# Patient Record
Sex: Female | Born: 1970 | Race: White | Hispanic: No | Marital: Single | Smoking: Former smoker
Health system: Southern US, Community
[De-identification: ages and names within clinical notes are randomized; demographics above are authoritative.]

## PROBLEM LIST (undated history)

## (undated) DIAGNOSIS — I10 Essential (primary) hypertension: Secondary | ICD-10-CM

## (undated) HISTORY — DX: Essential (primary) hypertension: I10

---

## 2009-05-28 ENCOUNTER — Emergency Department (HOSPITAL_BASED_OUTPATIENT_CLINIC_OR_DEPARTMENT_OTHER): Admission: EM | Admit: 2009-05-28 | Discharge: 2009-05-28 | Payer: Self-pay | Admitting: Emergency Medicine

## 2009-11-29 ENCOUNTER — Emergency Department (HOSPITAL_COMMUNITY): Admission: EM | Admit: 2009-11-29 | Discharge: 2009-11-29 | Payer: Self-pay | Admitting: Emergency Medicine

## 2009-12-07 ENCOUNTER — Emergency Department (HOSPITAL_BASED_OUTPATIENT_CLINIC_OR_DEPARTMENT_OTHER): Admission: EM | Admit: 2009-12-07 | Discharge: 2009-12-07 | Payer: Self-pay | Admitting: Emergency Medicine

## 2013-12-28 LAB — CBC AND DIFFERENTIAL
HEMATOCRIT: 46 % (ref 36–46)
Hemoglobin: 15.2 g/dL (ref 12.0–16.0)
Platelets: 316 10*3/uL (ref 150–399)
WBC: 9.2 10^3/mL

## 2013-12-28 LAB — BASIC METABOLIC PANEL
BUN: 11 mg/dL (ref 4–21)
Creatinine: 1 mg/dL (ref 0.5–1.1)
Glucose: 77 mg/dL
POTASSIUM: 4.6 mmol/L (ref 3.4–5.3)
SODIUM: 137 mmol/L (ref 137–147)

## 2014-01-28 ENCOUNTER — Ambulatory Visit (INDEPENDENT_AMBULATORY_CARE_PROVIDER_SITE_OTHER): Payer: PRIVATE HEALTH INSURANCE | Admitting: Physician Assistant

## 2014-01-28 ENCOUNTER — Encounter: Payer: Self-pay | Admitting: Physician Assistant

## 2014-01-28 VITALS — BP 143/76 | HR 86 | Ht 65.0 in | Wt 250.0 lb

## 2014-01-28 DIAGNOSIS — F411 Generalized anxiety disorder: Secondary | ICD-10-CM

## 2014-01-28 DIAGNOSIS — J45901 Unspecified asthma with (acute) exacerbation: Secondary | ICD-10-CM

## 2014-01-28 DIAGNOSIS — I10 Essential (primary) hypertension: Secondary | ICD-10-CM

## 2014-01-28 DIAGNOSIS — G4700 Insomnia, unspecified: Secondary | ICD-10-CM

## 2014-01-28 DIAGNOSIS — M542 Cervicalgia: Secondary | ICD-10-CM

## 2014-01-28 DIAGNOSIS — F329 Major depressive disorder, single episode, unspecified: Secondary | ICD-10-CM

## 2014-01-28 DIAGNOSIS — F32A Depression, unspecified: Secondary | ICD-10-CM

## 2014-01-28 DIAGNOSIS — F3289 Other specified depressive episodes: Secondary | ICD-10-CM

## 2014-01-28 DIAGNOSIS — J45909 Unspecified asthma, uncomplicated: Secondary | ICD-10-CM

## 2014-01-28 MED ORDER — BUDESONIDE-FORMOTEROL FUMARATE 80-4.5 MCG/ACT IN AERO: 2.0000 | INHALATION_SPRAY | Freq: Two times a day (BID) | RESPIRATORY_TRACT | Status: AC

## 2014-01-28 MED ORDER — CITALOPRAM HYDROBROMIDE 10 MG PO TABS
10.0000 mg | ORAL_TABLET | Freq: Every day | ORAL | Status: AC
Start: 2014-01-28 — End: ?

## 2014-01-28 MED ORDER — HYDROCODONE-ACETAMINOPHEN 7.5-325 MG PO TABS: 1.0000 | ORAL_TABLET | Freq: Four times a day (QID) | ORAL | Status: DC | PRN

## 2014-01-28 MED ORDER — LISINOPRIL 20 MG PO TABS: 20.0000 mg | ORAL_TABLET | Freq: Every day | ORAL | Status: AC

## 2014-01-28 MED ORDER — HYDROCHLOROTHIAZIDE 25 MG PO TABS: 25.0000 mg | ORAL_TABLET | Freq: Every day | ORAL | Status: AC

## 2014-01-28 NOTE — Patient Instructions (Addendum)
Needs spironmetry to schedule in next couple of weeks.  Start celexa 1/2 tab for 1 week and then increase to 1 tab.  Added Lisinopril 20mg  to HCTZ 25mg . Start symbicort 2 puffs twice a day.   Follow up 4-6 weeks.

## 2014-01-29 ENCOUNTER — Telehealth: Payer: Self-pay | Admitting: Physician Assistant

## 2014-01-29 DIAGNOSIS — G4700 Insomnia, unspecified: Secondary | ICD-10-CM | POA: Insufficient documentation

## 2014-01-29 DIAGNOSIS — M542 Cervicalgia: Secondary | ICD-10-CM | POA: Insufficient documentation

## 2014-01-29 DIAGNOSIS — J45909 Unspecified asthma, uncomplicated: Secondary | ICD-10-CM | POA: Insufficient documentation

## 2014-01-29 MED ORDER — PREDNISONE 50 MG PO TABS: ORAL_TABLET | ORAL | Status: AC

## 2014-01-29 NOTE — Telephone Encounter (Signed)
Ok to send prednisone 50mg  one tablet for 5 days. #5 no refills.

## 2014-01-29 NOTE — Telephone Encounter (Signed)
rx sent

## 2014-01-29 NOTE — Progress Notes (Signed)
Subjective:    Patient ID: Kari Harris, female    DOB: 23-Jun-1971, 43 y.o.   MRN: [id number]  HPI Pt is a 43 yo female who presents to the clinic to establish care.   . Active Ambulatory Problems    Diagnosis Date Noted  . Essential hypertension, benign 01/28/2014  . Unspecified asthma(493.90) 01/29/2014  . Insomnia 01/29/2014  . Cervical pain (neck) 01/29/2014   Resolved Ambulatory Problems    Diagnosis Date Noted  . No Resolved Ambulatory Problems   Past Medical History  Diagnosis Date  . Hypertension    . History   Social History  . Marital Status: Single    Spouse Name: N/A    Number of Children: N/A  . Years of Education: N/A   Occupational History  . Not on file.   Social History Main Topics  . Smoking status: Former Games developermoker  . Smokeless tobacco: Not on file  . Alcohol Use: No  . Drug Use: No  . Sexual Activity: Not Currently   Other Topics Concern  . Not on file   Social History Narrative  . No narrative on file   . Family History  Problem Relation Age of Onset  . Family history unknown: Yes   Pt has multiple problems today. She needs refills on all medications.   HTN- not currently on any medications due to needed refill. Denies any CP, palpitaitons, headaches, vision changes.   Pt has ongoing insomnia. She has tried trazodone which makes her drowsy the next day, xanax which helps but doesn't feel like it is enough, and [place] worked at 10[place]  but doctor took off. She just feels over the top anxious. Finds it hard to sit still, easily annoyed. She also has little to no energy, little interest in doing things. She does not remember trying any anti-depressants.   She is having a lot of problems breathing today. She was recently seen 12/31/2013 in ER for Asthma exacerbation she was given duoneb, and prednisone. She is feeling much better but having to use duoneb twice a day. Her chest fills very tight today. No fever, chills, sinus pressure,  ear pain, ST. Her cough is dry. She recently quit smoking 12/31/13. She does wheeze a lot throughout the day. Had asthma since she was a child.   She has ongoing cervical neck pain. Denies any known injury. Has been in pain for almost a year and 1/2 now. Taking norco and oxycodone at one point. Would like to get off. Tried PT in past and did help. Interested in injections. MrI scheduled for tomorrow. Pain is constant. Worse with movement. xrays showed lordosis but no significant disc herniation or spondoloysis.  Tried lyrica and gabapentin and helped not sure why not own now.    Review of Systems  All other systems reviewed and are negative.       Objective:   Physical Exam  Constitutional: She is oriented to person, place, and time. She appears well-developed and well-nourished.  HENT:  Head: Normocephalic and atraumatic.  Right Ear: External ear normal.  Left Ear: External ear normal.  Nose: Nose normal.  Mouth/Throat: Oropharynx is clear and moist.  Eyes: Conjunctivae are normal.  Neck: Normal range of motion. Neck supple.  Cardiovascular: Normal rate, regular rhythm and normal heart sounds.   Pulmonary/Chest:  Bilateral lung wheezing. Just had duoneb at her house.   Lymphadenopathy:    She has no cervical adenopathy.  Neurological: She is alert and oriented to person, place,  and time.  Skin: Skin is dry.  Psychiatric: She has a normal mood and affect. Her behavior is normal.          Assessment & Plan:  Asthma uncontrolled- given albuterol inhaler to take home to carry with her to use every 4-6 hours as needed for wheezing. Continue to use duoneb as needed but hopefully should start to use less and less.  Started symbicort 2 puffs BID daily. Follow up in 4 weeks.   GAD/Depression/insomnia- GAD-7 was 16. PHQ-9 was 17. I would like to add celexa to daily medication to help with sleep. Continue xanax but as celexa takes root can use as needed. Follow up in 4 weeks. Discussed  counselor may help.   HTN- restart lisinopril and HCTZ. Follow up in 4 weeks.   Smoking cessation- encouraged pt along journey. Continue on nicoderm daily.   Cervical neck pain- MRI tomorrow so will know more. Will refill norco prn every 8 hours today. Discussed with pt that we not do long term pain and need to look at more interventional approach. Did not have time today to fully evaluate. Follow up in 4 weeks.

## 2014-01-29 NOTE — Telephone Encounter (Signed)
Spoke with pt & she states that the symbicort we gave her is helping some but her breathing still isn't great. She would like prednisone.

## 2014-01-29 NOTE — Telephone Encounter (Signed)
Find out how pt is breathing. Went over note and realized I did not treat with another round of steroids. If breathing not getting better let me know and will send a few days of prednisone to pharmacy.

## 2014-02-11 ENCOUNTER — Other Ambulatory Visit: Payer: PRIVATE HEALTH INSURANCE | Admitting: Physician Assistant

## 2014-02-12 ENCOUNTER — Encounter: Payer: Self-pay | Admitting: *Deleted

## 2015-01-27 ENCOUNTER — Emergency Department (HOSPITAL_COMMUNITY): Payer: PRIVATE HEALTH INSURANCE

## 2015-01-27 ENCOUNTER — Emergency Department (HOSPITAL_COMMUNITY)
Admission: EM | Admit: 2015-01-27 | Discharge: 2015-01-27 | Disposition: A | Discharge status: Home / Self Care | Payer: Self-pay | Attending: Emergency Medicine | Admitting: Emergency Medicine

## 2015-01-27 ENCOUNTER — Encounter (HOSPITAL_COMMUNITY): Payer: Self-pay | Admitting: Emergency Medicine

## 2015-01-27 ENCOUNTER — Emergency Department (HOSPITAL_COMMUNITY): Payer: Self-pay

## 2015-01-27 DIAGNOSIS — Z87891 Personal history of nicotine dependence: Secondary | ICD-10-CM | POA: Insufficient documentation

## 2015-01-27 DIAGNOSIS — R109 Unspecified abdominal pain: Secondary | ICD-10-CM

## 2015-01-27 DIAGNOSIS — Z720 Tobacco use: Secondary | ICD-10-CM

## 2015-01-27 DIAGNOSIS — M549 Dorsalgia, unspecified: Secondary | ICD-10-CM | POA: Insufficient documentation

## 2015-01-27 DIAGNOSIS — R911 Solitary pulmonary nodule: Secondary | ICD-10-CM | POA: Insufficient documentation

## 2015-01-27 DIAGNOSIS — Z79899 Other long term (current) drug therapy: Secondary | ICD-10-CM | POA: Insufficient documentation

## 2015-01-27 DIAGNOSIS — R319 Hematuria, unspecified: Secondary | ICD-10-CM | POA: Insufficient documentation

## 2015-01-27 DIAGNOSIS — I10 Essential (primary) hypertension: Secondary | ICD-10-CM | POA: Insufficient documentation

## 2015-01-27 LAB — URINALYSIS, ROUTINE W REFLEX MICROSCOPIC
Bilirubin Urine: NEGATIVE
Glucose, UA: NEGATIVE mg/dL
Ketones, ur: NEGATIVE mg/dL
LEUKOCYTES UA: NEGATIVE
NITRITE: NEGATIVE
Protein, ur: NEGATIVE mg/dL
SPECIFIC GRAVITY, URINE: 1.021 (ref 1.005–1.030)
Urobilinogen, UA: 0.2 mg/dL (ref 0.0–1.0)
pH: 7 (ref 5.0–8.0)

## 2015-01-27 LAB — COMPREHENSIVE METABOLIC PANEL
ALBUMIN: 3.8 g/dL (ref 3.5–5.2)
ALK PHOS: 86 U/L (ref 39–117)
ALT: 12 U/L (ref 0–35)
AST: 14 U/L (ref 0–37)
Anion gap: 7 (ref 5–15)
BUN: 7 mg/dL (ref 6–23)
CALCIUM: 9 mg/dL (ref 8.4–10.5)
CO2: 25 mmol/L (ref 19–32)
Chloride: 107 mmol/L (ref 96–112)
Creatinine, Ser: 0.76 mg/dL (ref 0.50–1.10)
GFR calc Af Amer: >90 mL/min (ref >90)
GFR calc non Af Amer: >90 mL/min (ref >90)
Glucose, Bld: 88 mg/dL (ref 70–99)
Potassium: 4.1 mmol/L (ref 3.5–5.1)
SODIUM: 139 mmol/L (ref 135–145)
TOTAL PROTEIN: 7 g/dL (ref 6.0–8.3)
Total Bilirubin: 0.6 mg/dL (ref 0.3–1.2)

## 2015-01-27 LAB — CBC
HEMATOCRIT: 41.2 % (ref 36.0–46.0)
Hemoglobin: 13.6 g/dL (ref 12.0–15.0)
MCH: 29.2 pg (ref 26.0–34.0)
MCHC: 33 g/dL (ref 30.0–36.0)
MCV: 88.6 fL (ref 78.0–100.0)
Platelets: 281 10*3/uL (ref 150–400)
RBC: 4.65 MIL/uL (ref 3.87–5.11)
RDW: 12.7 % (ref 11.5–15.5)
WBC: 7.3 10*3/uL (ref 4.0–10.5)

## 2015-01-27 LAB — URINE MICROSCOPIC-ADD ON

## 2015-01-27 MED ORDER — IOHEXOL 300 MG/ML  SOLN
80.0000 mL | Freq: Once | INTRAMUSCULAR | Status: AC | PRN
  Administered 2015-01-27: 80 mL via INTRAVENOUS

## 2015-01-27 MED ORDER — ONDANSETRON 4 MG PO TBDP
4.0000 mg | ORAL_TABLET | Freq: Once | ORAL | Status: AC
  Administered 2015-01-27: 4 mg via ORAL
  Filled 2015-01-27 (×2): qty 1

## 2015-01-27 MED ORDER — OXYCODONE-ACETAMINOPHEN 5-325 MG PO TABS
2.0000 | ORAL_TABLET | Freq: Once | ORAL | Status: AC
  Administered 2015-01-27: 2 via ORAL
  Filled 2015-01-27: qty 2

## 2015-01-27 MED ORDER — KETOROLAC TROMETHAMINE 60 MG/2ML IM SOLN
60.0000 mg | Freq: Once | INTRAMUSCULAR | Status: AC
  Administered 2015-01-27: 60 mg via INTRAMUSCULAR

## 2015-01-27 MED ORDER — IPRATROPIUM-ALBUTEROL 0.5-2.5 (3) MG/3ML IN SOLN
3.0000 mL | Freq: Once | RESPIRATORY_TRACT | Status: AC
  Administered 2015-01-27: 3 mL via RESPIRATORY_TRACT
  Filled 2015-01-27: qty 3

## 2015-01-27 MED ORDER — HYDROCODONE-ACETAMINOPHEN 5-325 MG PO TABS: 2.0000 | ORAL_TABLET | ORAL | Status: AC | PRN

## 2015-01-27 MED ORDER — MORPHINE SULFATE 4 MG/ML IJ SOLN
6.0000 mg | Freq: Once | INTRAMUSCULAR | Status: AC
  Administered 2015-01-27: 6 mg via INTRAMUSCULAR
  Filled 2015-01-27: qty 2

## 2015-01-27 MED ORDER — KETOROLAC TROMETHAMINE 30 MG/ML IJ SOLN
60.0000 mg | Freq: Once | INTRAMUSCULAR | Status: DC
  Filled 2015-01-27: qty 2

## 2015-01-27 NOTE — ED Notes (Signed)
Pt states she has a hx of kidney stones and this feel as if there is another one, c/o lower right back pain and blood in urine x 3 days

## 2015-01-27 NOTE — Discharge Instructions (Signed)
Pulmonary Nodule A pulmonary nodule is a small, round growth of tissue in the lung. Pulmonary nodules can range in size from less than 1/5 inch (4 mm) to a little bigger than an inch (25 mm). Most pulmonary nodules are detected when imaging tests of the lung are being performed for a different problem. Pulmonary nodules are usually not cancerous (benign). However, some pulmonary nodules are cancerous (malignant). Follow-up treatment or testing is based on the size of the pulmonary nodule and your risk of getting lung cancer.  CAUSES Benign pulmonary nodules can be caused by various things. Some of the causes include:   Bacterial, fungal, or viral infections. This is usually an old infection that is no longer active, but it can sometimes be a current, active infection.  A benign mass of tissue.  Inflammation from conditions such as rheumatoid arthritis.   Abnormal blood vessels in the lungs. Malignant pulmonary nodules can result from lung cancer or from cancers that spread to the lung from other places in the body. SIGNS AND SYMPTOMS Pulmonary nodules usually do not cause symptoms. DIAGNOSIS Most often, pulmonary nodules are found incidentally when an X-ray or CT scan is performed to look for some other problem in the lung area. To help determine whether a pulmonary nodule is benign or malignant, your health care provider will take a medical history and order a variety of tests. Tests done may include:   Blood tests.  A skin test called a tuberculin test. This test is used to determine if you have been exposed to the germ that causes tuberculosis.   Chest X-rays. If possible, a new X-ray may be compared with X-rays you have had in the past.   CT scan. This test shows smaller pulmonary nodules more clearly than an X-ray.   Positron emission tomography (PET) scan. In this test, a safe amount of a radioactive substance is injected into the bloodstream. Then, the scan takes a picture of  the pulmonary nodule. The radioactive substance is eliminated from your body in your urine.   Biopsy. A tiny piece of the pulmonary nodule is removed so it can be checked under a microscope. TREATMENT  Pulmonary nodules that are benign normally do not require any treatment because they usually do not cause symptoms or breathing problems. Your health care provider may want to monitor the pulmonary nodule through follow-up CT scans. The frequency of these CT scans will vary based on the size of the nodule and the risk factors for lung cancer. For example, CT scans will need to be done more frequently if the pulmonary nodule is larger and if you have a history of smoking and a family history of cancer. Further testing or biopsies may be done if any follow-up CT scan shows that the size of the pulmonary nodule has increased. HOME CARE INSTRUCTIONS  Only take over-the-counter or prescription medicines as directed by your health care provider.  Keep all follow-up appointments with your health care provider. SEEK MEDICAL CARE IF:  You have trouble breathing when you are active.   You feel sick or unusually tired.   You do not feel like eating.   You lose weight without trying to.   You develop chills or night sweats.  SEEK IMMEDIATE MEDICAL CARE IF:  You cannot catch your breath, or you begin wheezing.   You cannot stop coughing.   You cough up blood.   You become dizzy or feel like you are going to pass out.   You  have sudden chest pain.   You have a fever or persistent symptoms for more than 2-3 days.   You have a fever and your symptoms suddenly get worse. MAKE SURE YOU:  Understand these instructions.  Will watch your condition.  Will get help right away if you are not doing well or get worse. Document Released: 08/15/2009 Document Revised: 06/20/2013 Document Reviewed: 04/09/2013 [facility] Patient Information 2015 [place], [place]. This information is not intended  to replace advice given to you by your health care provider. Make sure you discuss any questions you have with your health care provider.  Hematuria Hematuria is blood in your urine. It can be caused by a bladder infection, kidney infection, prostate infection, kidney stone, or cancer of your urinary tract. Infections can usually be treated with medicine, and a kidney stone usually will pass through your urine. If neither of these is the cause of your hematuria, further workup to find out the reason may be needed. It is very important that you tell your health care provider about any blood you see in your urine, even if the blood stops without treatment or happens without causing pain. Blood in your urine that happens and then stops and then happens again can be a symptom of a very serious condition. Also, pain is not a symptom in the initial stages of many urinary cancers. HOME CARE INSTRUCTIONS   Drink lots of fluid, 3-4 quarts a day. If you have been diagnosed with an infection, cranberry juice is especially recommended, in addition to large amounts of water.  Avoid caffeine, tea, and carbonated beverages because they tend to irritate the bladder.  Avoid alcohol because it may irritate the prostate.  Take all medicines as directed by your health care provider.  If you were prescribed an antibiotic medicine, finish it all even if you start to feel better.  If you have been diagnosed with a kidney stone, follow your health care provider's instructions regarding straining your urine to catch the stone.  Empty your bladder often. Avoid holding urine for long periods of time.  After a bowel movement, women should cleanse front to back. Use each tissue only once.  Empty your bladder before and after sexual intercourse if you are a female. SEEK MEDICAL CARE IF:  You develop back pain.  You have a fever.  You have a feeling of sickness in your stomach (nausea) or vomiting.  Your symptoms  are not better in 3 days. Return sooner if you are getting worse. SEEK IMMEDIATE MEDICAL CARE IF:   You develop severe vomiting and are unable to keep the medicine down.  You develop severe back or abdominal pain despite taking your medicines.  You begin passing a large amount of blood or clots in your urine.  You feel extremely weak or faint, or you pass out. MAKE SURE YOU:   Understand these instructions.  Will watch your condition.  Will get help right away if you are not doing well or get worse. Document Released: 10/18/2005 Document Revised: 03/04/2014 Document Reviewed: 06/18/2013 [facility] Patient Information 2015 [place], [place]. This information is not intended to replace advice given to you by your health care provider. Make sure you discuss any questions you have with your health care provider.

## 2015-01-27 NOTE — ED Provider Notes (Signed)
CSN: [id number]     Arrival date & time 01/27/15  1230 History   First MD Initiated Contact with Patient 01/27/15 1457     Chief Complaint  Patient presents with  . Hematuria  . Back Pain     (Consider location/radiation/quality/duration/timing/severity/associated sxs/prior Treatment) Patient is a 44 y.o. female presenting with hematuria and flank pain. The history is provided by the patient. No language interpreter was used.  Hematuria This is a new problem. The current episode started more than 2 days ago. Episode frequency: intermittently. The problem has not changed since onset.Associated symptoms include abdominal pain. Pertinent negatives include no chest pain, no headaches and no shortness of breath. Nothing aggravates the symptoms. Nothing relieves the symptoms. She has tried nothing for the symptoms. The treatment provided no relief.  Flank Pain This is a new problem. The current episode started more than 2 days ago. The problem occurs daily (intermittent). The problem has not changed since onset.Associated symptoms include abdominal pain. Pertinent negatives include no chest pain, no headaches and no shortness of breath. Associated symptoms comments: Chills, subjective fever, n/v, d/a, hematuria. Nothing aggravates the symptoms. Nothing relieves the symptoms. She has tried nothing for the symptoms. The treatment provided no relief.    Past Medical History  Diagnosis Date  . Hypertension    History reviewed. No pertinent past surgical history. Family History  Problem Relation Age of Onset  . Family history unknown: Yes   History  Substance Use Topics  . Smoking status: Former Games developer  . Smokeless tobacco: Not on file  . Alcohol Use: No   OB History    No data available     Review of Systems  Constitutional: Negative for fever, chills, diaphoresis, activity change, appetite change and fatigue.  HENT: Negative for congestion, facial swelling, rhinorrhea and sore throat.    Eyes: Negative for photophobia and discharge.  Respiratory: Negative for cough, chest tightness and shortness of breath.   Cardiovascular: Negative for chest pain, palpitations and leg swelling.  Gastrointestinal: Positive for abdominal pain. Negative for nausea, vomiting and diarrhea.  Endocrine: Negative for polydipsia and polyuria.  Genitourinary: Positive for hematuria and flank pain. Negative for dysuria, frequency, difficulty urinating and pelvic pain.  Musculoskeletal: Positive for back pain. Negative for arthralgias, neck pain and neck stiffness.  Skin: Negative for color change and wound.  Allergic/Immunologic: Negative for immunocompromised state.  Neurological: Negative for facial asymmetry, weakness, numbness and headaches.  Hematological: Does not bruise/bleed easily.  Psychiatric/Behavioral: Negative for confusion and agitation.      Allergies  Review of patient's allergies indicates no known allergies.  Home Medications   Prior to Admission medications   Medication Sig Start Date End Date Taking? Authorizing Provider  acetaminophen (TYLENOL) 500 MG tablet Take 1,000 mg by mouth every 6 (six) hours as needed (For pain.).   Yes Historical Provider, MD  albuterol (PROVENTIL HFA;VENTOLIN HFA) 108 (90 BASE) MCG/ACT inhaler Inhale 2 puffs into the lungs every 4 (four) hours as needed for wheezing or shortness of breath.   Yes Historical Provider, MD  ALPRAZolam (XANAX) 0.25 MG tablet Take 0.25 mg by mouth 3 (three) times daily as needed for anxiety.   Yes Historical Provider, MD  budesonide-formoterol (SYMBICORT) 80-4.5 MCG/ACT inhaler Inhale 2 puffs into the lungs 2 (two) times daily. 01/28/14  Yes Jade L Breeback, PA-C  citalopram (CELEXA) 10 MG tablet Take 1 tablet (10 mg total) by mouth daily. 01/28/14  Yes Jade L Breeback, PA-C  hydrochlorothiazide (HYDRODIURIL) 25 MG  tablet Take 1 tablet (25 mg total) by mouth daily. 01/28/14  Yes Jade L Breeback, PA-C   ipratropium-albuterol (DUONEB) 0.5-2.5 (3) MG/3ML SOLN Take 3 mLs by nebulization every 4 (four) hours as needed (For shortness of breath.).    Yes Historical Provider, MD  lisinopril (PRINIVIL,ZESTRIL) 20 MG tablet Take 1 tablet (20 mg total) by mouth daily. 01/28/14  Yes Jade L Breeback, PA-C  nicotine (NICODERM CQ - DOSED IN MG/24 HR) 7 mg/24hr patch Place 7 mg onto the skin daily.    Yes Historical Provider, MD  HYDROcodone-acetaminophen (NORCO) 5-325 MG per tablet Take 2 tablets by mouth every 4 (four) hours as needed. 01/27/15   Toy Cookey, MD  predniSONE (DELTASONE) 50 MG tablet Take one tablet daily for 5 days Patient not taking: Reported on 01/27/2015 01/29/14   Jade L Breeback, PA-C   BP 132/65 mmHg  Pulse 67  Temp(Src) 97.9 F (36.6 C) (Oral)  Resp 14  SpO2 98%  LMP 01/16/2015 (Exact Date) Physical Exam  Constitutional: She is oriented to person, place, and time. She appears well-developed and well-nourished. No distress.  HENT:  Head: Normocephalic and atraumatic.  Mouth/Throat: No oropharyngeal exudate.  Eyes: Pupils are equal, round, and reactive to light.  Neck: Normal range of motion. Neck supple.  Cardiovascular: Normal rate, regular rhythm and normal heart sounds.  Exam reveals no gallop and no friction rub.   No murmur heard. Pulmonary/Chest: Effort normal and breath sounds normal. No respiratory distress. She has no wheezes. She has no rales.  Abdominal: Soft. Bowel sounds are normal. She exhibits no distension and no mass. There is no tenderness. There is CVA tenderness (right). There is no rebound and no guarding.  Musculoskeletal: Normal range of motion. She exhibits no edema or tenderness.  Neurological: She is alert and oriented to person, place, and time.  Skin: Skin is warm and dry.  Psychiatric: She has a normal mood and affect.    ED Course  Procedures (including critical care time) Labs Review Labs Reviewed  URINALYSIS, ROUTINE W REFLEX MICROSCOPIC -  Abnormal; Notable for the following:    APPearance CLOUDY (*)    Hgb urine dipstick LARGE (*)    All other components within normal limits  URINE MICROSCOPIC-ADD ON - Abnormal; Notable for the following:    Squamous Epithelial / LPF MANY (*)    Bacteria, UA FEW (*)    All other components within normal limits  CBC  COMPREHENSIVE METABOLIC PANEL    Imaging Review Ct Chest W Contrast  01/27/2015   CLINICAL DATA:  44 year old female with pulmonary mass identified on abdominal CT performed earlier today.  EXAM: CT CHEST WITH CONTRAST  TECHNIQUE: Multidetector CT imaging of the chest was performed during intravenous contrast administration.  CONTRAST:  80mL OMNIPAQUE IOHEXOL 300 MG/ML  SOLN  COMPARISON:  01/27/2015 abdominal CT.  FINDINGS: Mediastinum/Nodes: The heart and great vessels are unremarkable. There is no evidence of pericardial effusion. There is no evidence of pleural effusion. No enlarged lymph nodes are identified within the mediastinum.  Lungs/Pleura: A 3 x 3.5 x 4 cm left infrahilar soft tissue structure/mass contain single coarse calcification. Distal to this mass there is moderate cylindrical bronchiectasis and left lower lobe volume loss/atelectasis. No other pulmonary abnormalities are identified.  Upper abdomen: Unremarkable  Musculoskeletal: No acute or suspicious abnormalities are identified. A remote fracture of the right eighth rib is identified.  IMPRESSION: 3 x 3.5 x 4 cm left infrahilar mass with distal left lower lobe cylindrical  bronchiectasis and volume loss/ atelectasis. Recommend pulmonary/thoracic surgery consultation.  No other significant abnormalities identified.   Electronically Signed   By: Harmon PierJeffrey  Hu M.D.   On: 01/27/2015 20:17   Ct Renal Stone Study  01/27/2015   CLINICAL DATA:  Initial encounter for 2 day history of right flank pain with hematuria and nausea/vomiting.  EXAM: CT ABDOMEN AND PELVIS WITHOUT CONTRAST  TECHNIQUE: Multidetector CT imaging of the  abdomen and pelvis was performed following the standard protocol without IV contrast.  COMPARISON:  None.  FINDINGS: Lower chest: Although incompletely visualized, a 3.3 x 3.9 cm left infrahilar mass is identified with an area of associated bronchiectasis.  Hepatobiliary: No focal abnormality in the liver on this study without intravenous contrast. No evidence for hepatomegaly. Gallbladder is surgically absent. No intrahepatic or extrahepatic biliary dilation.  Pancreas: No focal mass lesion. No dilatation of the main duct. No intraparenchymal cyst. No peripancreatic edema.  Spleen: No splenomegaly. No focal mass lesion.  Adrenals/Urinary Tract: Adrenal glands are normal. 1 mm nonobstructing stone is identified in the interpolar right kidney. No evidence for left renal stones. No left renal stones. No evidence for hydroureteronephrosis. Bladder is normal without evidence for bladder stones.  Stomach/Bowel: Stomach is nondistended. No gastric wall thickening. No evidence of outlet obstruction. Duodenum is normally positioned as is the ligament of Treitz. No small bowel wall thickening. No small bowel dilatation. Terminal ileum is normal. Appendix is normal. 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  Vascular/Lymphatic: No abdominal aortic aneurysm. No gastrohepatic or hepatoduodenal ligament lymphadenopathy. No retroperitoneal lymphadenopathy. No pelvic sidewall lymphadenopathy.  Reproductive: Uterus is unremarkable.  No adnexal mass.  Other: No intraperitoneal free fluid.  Musculoskeletal: Bone windows reveal no worrisome lytic or sclerotic osseous lesions.  IMPRESSION: 1. 1 mm nonobstructing right renal stone. No secondary changes in the right kidney or ureter. 2. 4 cm masslike opacity in the left infrahilar region with associated bronchiectasis. This may be related to an area of postinfectious or postinflammatory scarring, but neoplasm cannot be entirely excluded. CT of  the chest with contrast is recommended to further evaluate.   Electronically Signed   By: Kennith CenterEric  Mansell M.D.   On: 01/27/2015 16:34     EKG Interpretation None      MDM   Final diagnoses:  Pulmonary nodule  Tobacco use  Hematuria    Pt is a 44 y.o. female with Pmhx as above who presents with about 1 week of intermittent L flank pain with radiation to the left lower quadrant with associated subjective fevers, chills, nausea, vomiting, several episodes of loose stool today as well as hematuria without dysuria or frequency.  As opposed to nursing notes, pt denies a history of prior kidney stones to me.  On PE, VSS, pt in NAD. Triage labs show nml Cr. Waiting for UA.Will get CT stone study.  Patient not complaining of shortness of breath, though has diffuse wheezing on physical exam, and states she has been without her home breathing treatments since losing her job recently, we'll give a DuoNeb here.  CT stone study with 1 mm nonobstructing right renal stone.  No changes in the right kidney or ureter.  She has a incidental 4 cm masslike opacity in the left infrahilar region, associated bronchiectasis, radiology recommending CT of the chest with contrast, which was ordered and showed a 3 x 3 point 0.5 x 4 cm left infrahilar soft tissue structure/mass containing a single coarse calcification with bronchiectasis.  I spoke with pulmonary M.D., on-call, who recommended patient.  Cough is, tomorrow to schedule pulmonary appointment.  Social work has also spoken with patient.  She has been given info for clinic local to the area as well as [facility].  I do not believe this incidental lung finding is related to her flank pain.  She may have passed a stone recently.  She has not had evidence of UTI/pyelonephritis  I feel she is safe for discharge and outpatient follow-up,   Quinnlyn Repsher evaluation in the Emergency Department is complete. It has been  determined that no acute conditions requiring further emergency intervention are present at this time. The patient/guardian have been advised of the diagnosis and plan. We have discussed signs and symptoms that warrant return to the ED, such as changes or worsening in symptoms, worsening pain, fever, trouble breathing.       Toy Cookey, MD 01/28/15 [phone]

## 2015-01-31 ENCOUNTER — Institutional Professional Consult (permissible substitution): Payer: PRIVATE HEALTH INSURANCE | Admitting: Pulmonary Disease

## 2015-02-05 ENCOUNTER — Telehealth: Payer: Self-pay | Admitting: Pulmonary Disease

## 2015-02-05 ENCOUNTER — Encounter: Payer: Self-pay | Admitting: *Deleted

## 2015-02-05 NOTE — Telephone Encounter (Signed)
Pt had an appt with Dr. Kriste BasqueNadel on January 31, 2015 for lung mass and no showed. I ATC pt's home #, [phone], on Friday, January 31, 2015 and today.  Received msg that this # has been disconnected or is no longer in service.   I ATC # listed as pt's work #, [phone], received automated system requiring you to know an extension to be connected to someone.  I ATC reach a live person by searching for an extension using pt's last name.  I was transferred to an extension line but this had no named VM on Friday, January 31, 2015 and again today.  Therefore, I did not leave a msg.   Renee PainJessica McCraw is listed as pt's Emergency Contact.  I ATC Jessica on # listed as her home #, [phone].  I spoke with a lady who advised this is the wrong # for Shanda BumpsJessica.  States she's had this # since January of this year and continues to receive calls for this person.  I verified with lady # was dialed correctly as in chart.   I ATC Jessica on the # listed as her mobile #, [phone].  I spoke with a lady who stated she was Jessica's mother and reported, "The last time I heard Hughie ClossBridgette was supposed to move to [place]."  She had no contact information to pt but was able to provide me with Jessica's correct contact #, [phone].  I called this # and was able to contact Shanda BumpsJessica who reports she hasn't had contact with pt for a while.  Shanda BumpsJessica was unable to provide a contact phone # for pt but does believe pt's current address is [address] [place], [place] [id number].   Upon further review of pt's chart in Care Everywhere, pt has a contact # of [phone]. [facility] from 02/01/15-02/04/15 and they have assumed her care regarding this issue.  Therefore, will sign off.

## 2016-04-15 IMAGING — CT CT CHEST W/ CM
1 of 2 series · 15 of 30 positions shown, 19 images · IV contrast (OMNIPAQUE 300)
Comparison: 01/27/2015 abdominal CT.

CLINICAL DATA: 43-year-old female with pulmonary mass identified on
abdominal CT performed earlier today.

EXAM:
CT CHEST WITH CONTRAST
TECHNIQUE: Multidetector CT imaging of the chest was performed during
intravenous contrast administration.
CONTRAST:  80mL OMNIPAQUE IOHEXOL 300 MG/ML  SOLN

[Series 2: chest with st · axial · 0.75mm/px · z∈[-249,-49]mm · 15 of 46 slices shown, 19 images]
[im 3/46  mediastinal]
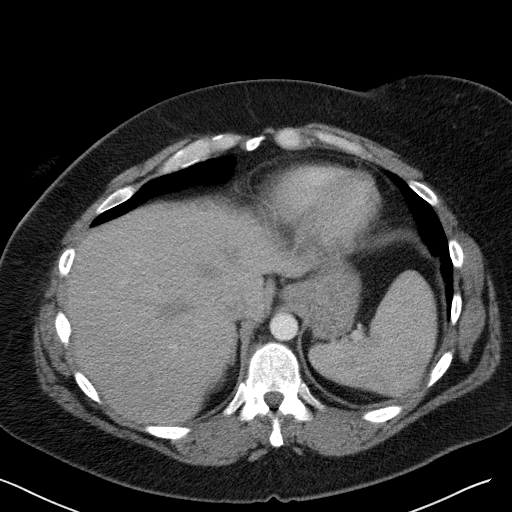
[im 3/46  lung]
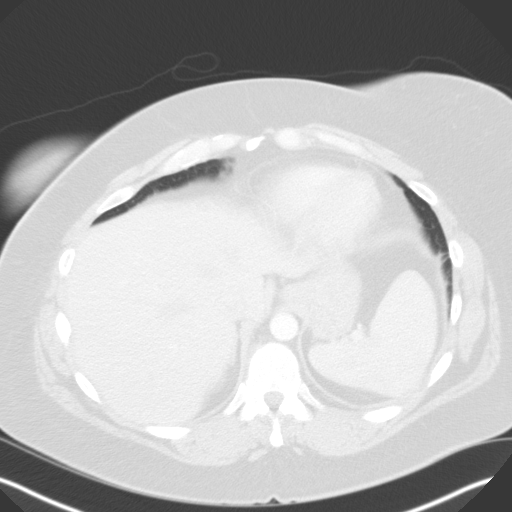
[im 7/46  lung]
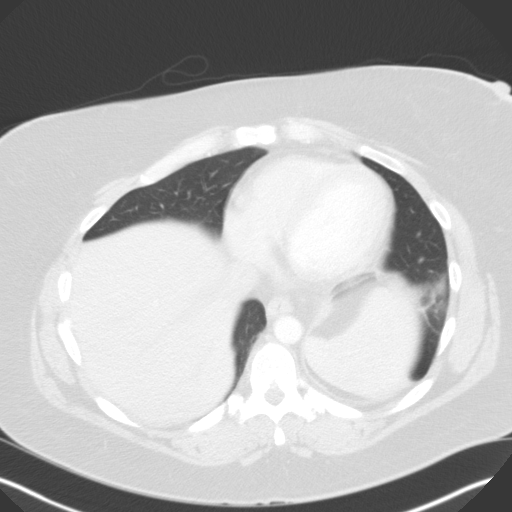
[im 9/46  lung]
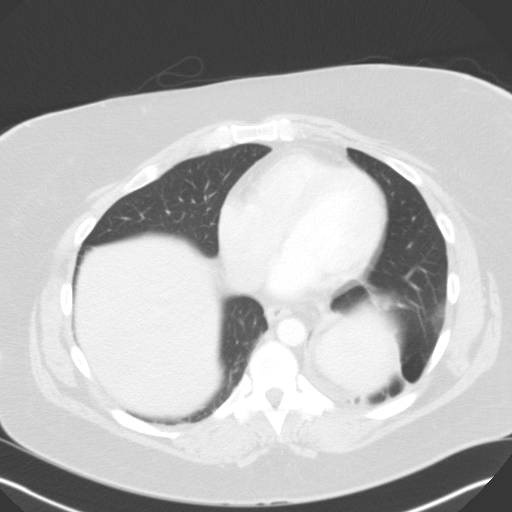
[im 12/46  lung]
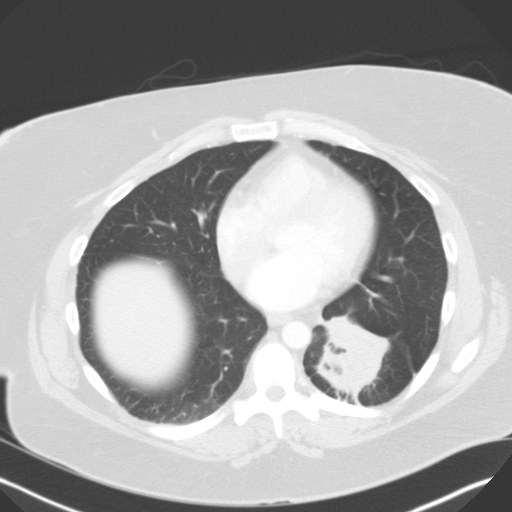
[im 13/46  mediastinal]
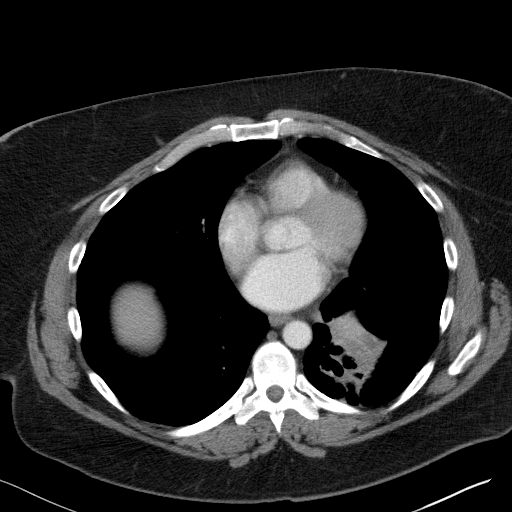
[im 13/46  lung]
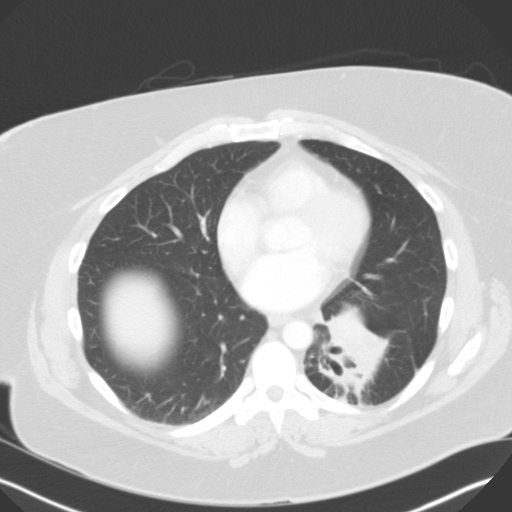
[im 17/46  lung]
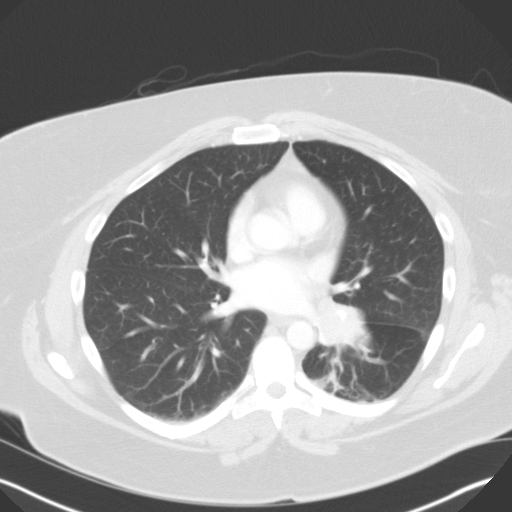
[im 19/46  lung]
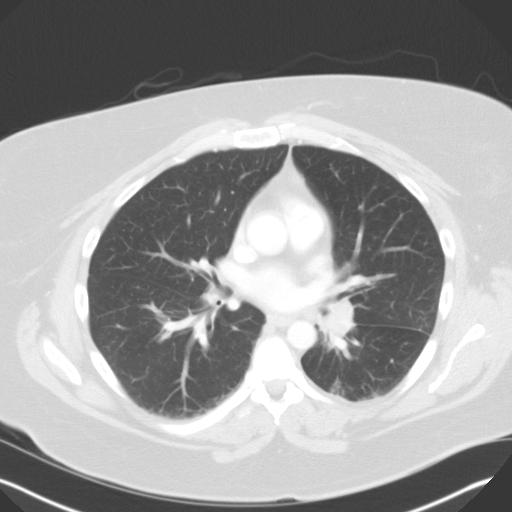
[im 23/46  lung]
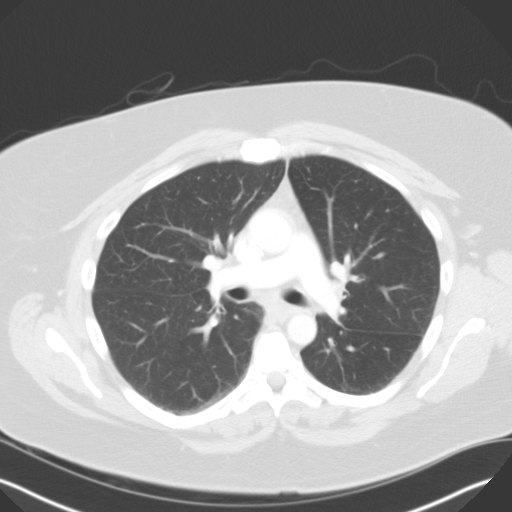
[im 27/46  mediastinal]
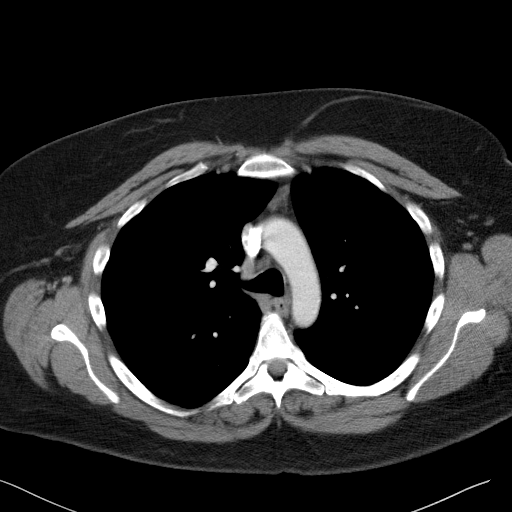
[im 27/46  lung]
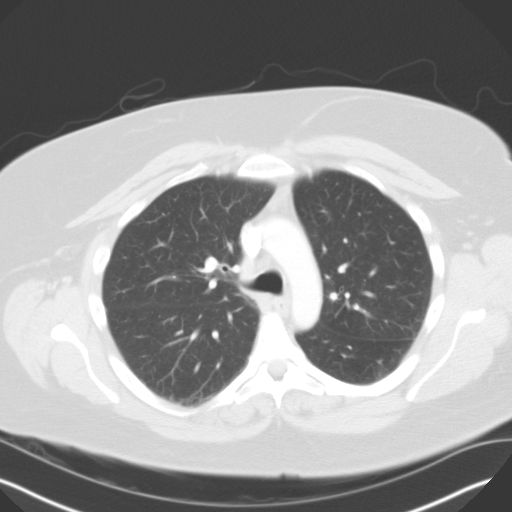
[im 29/46  lung]
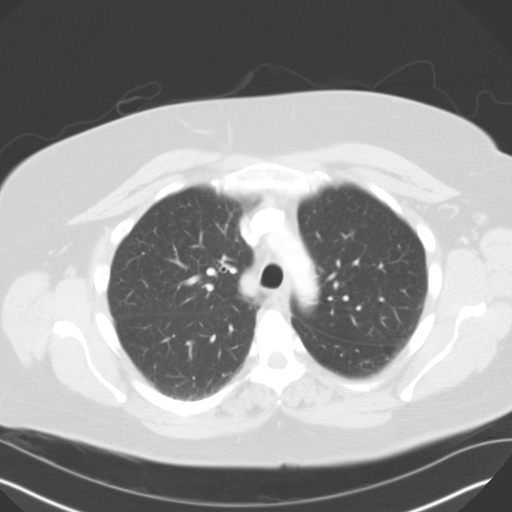
[im 33/46  lung]
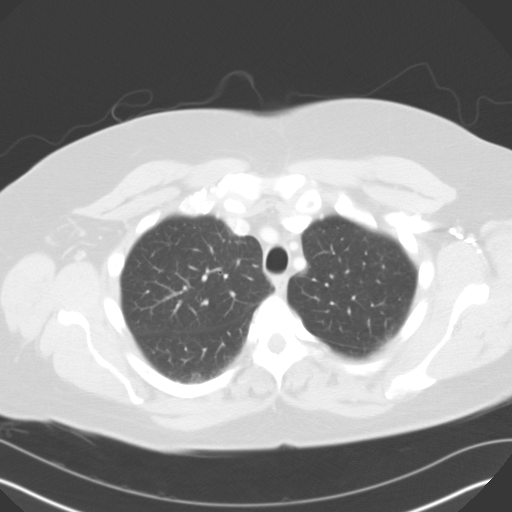
[im 34/46  lung]
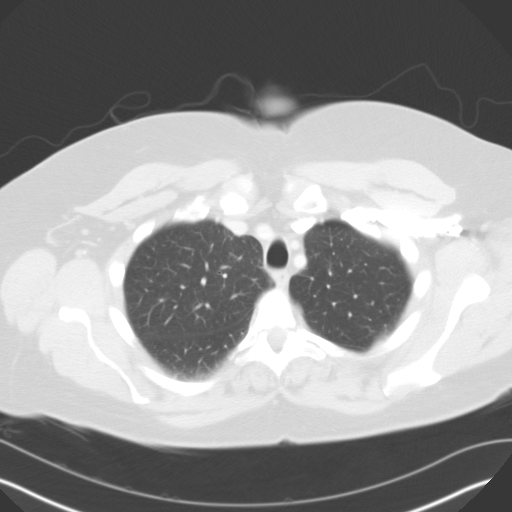
[im 37/46  mediastinal]
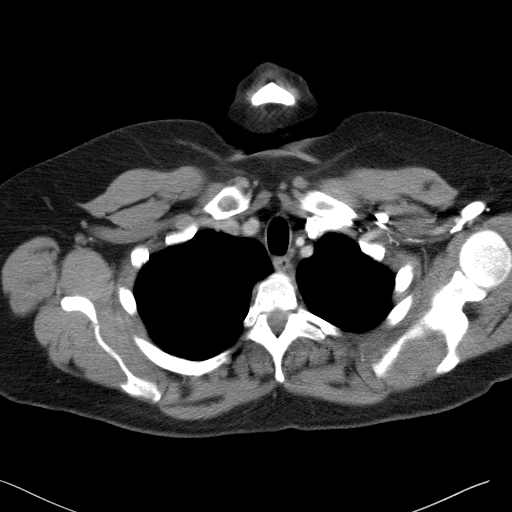
[im 37/46  lung]
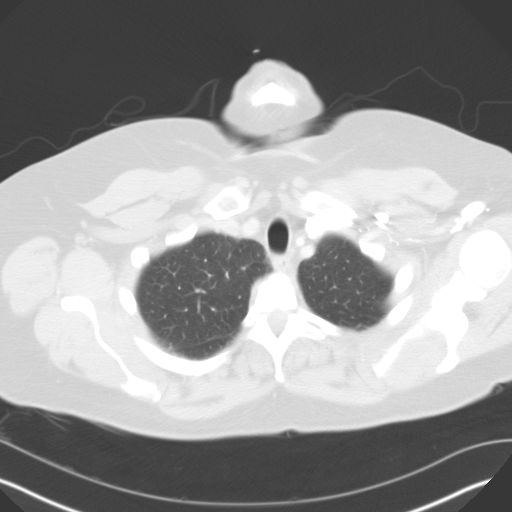
[im 39/46  lung]
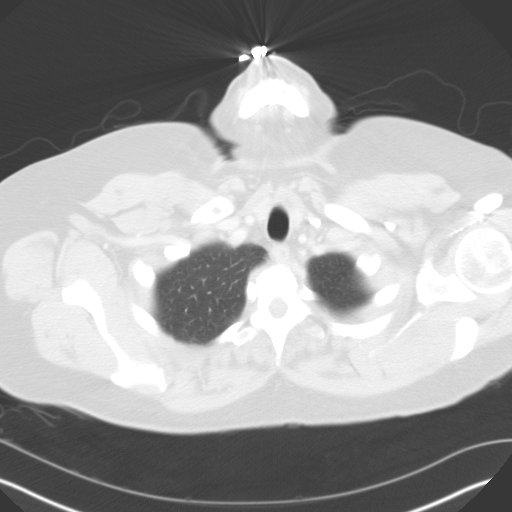
[im 43/46  lung]
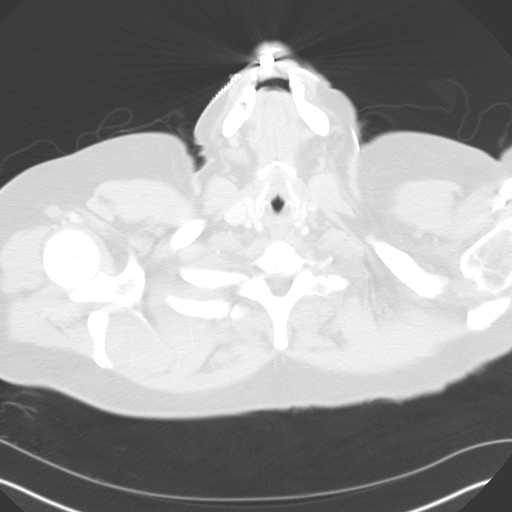

[15 of 30 positions shown; findings below may reference images not displayed]

FINDINGS: Mediastinum/Nodes: The heart and great vessels are unremarkable.
There is no evidence of pericardial effusion. There is no evidence
of pleural effusion. No enlarged lymph nodes are identified within
the mediastinum.

Lungs/Pleura: A 3 x 3.5 x 4 cm left infrahilar soft tissue
structure/mass contain single coarse calcification. Distal to this
mass there is moderate cylindrical bronchiectasis and left lower
lobe volume loss/atelectasis. No other pulmonary abnormalities are
identified.

Upper abdomen: Unremarkable

Musculoskeletal: No acute or suspicious abnormalities are
identified. A remote fracture of the right eighth rib is identified.
IMPRESSION: 3 x 3.5 x 4 cm left infrahilar mass with distal left lower lobe
cylindrical bronchiectasis and volume loss/ atelectasis. Recommend
pulmonary/thoracic surgery consultation.

No other significant abnormalities identified.

## 2020-01-31 DEATH — deceased
